# Patient Record
Sex: Female | Born: 1960 | Race: White | Hispanic: No | Marital: Married | Smoking: Current every day smoker
Health system: Southern US, Community
[De-identification: ages and names within clinical notes are randomized; demographics above are authoritative.]

## PROBLEM LIST (undated history)

## (undated) DIAGNOSIS — B351 Tinea unguium: Secondary | ICD-10-CM

## (undated) DIAGNOSIS — L409 Psoriasis, unspecified: Secondary | ICD-10-CM

## (undated) DIAGNOSIS — R079 Chest pain, unspecified: Secondary | ICD-10-CM

## (undated) DIAGNOSIS — K219 Gastro-esophageal reflux disease without esophagitis: Secondary | ICD-10-CM

## (undated) DIAGNOSIS — I209 Angina pectoris, unspecified: Secondary | ICD-10-CM

## (undated) DIAGNOSIS — R5383 Other fatigue: Secondary | ICD-10-CM

## (undated) DIAGNOSIS — J309 Allergic rhinitis, unspecified: Secondary | ICD-10-CM

## (undated) DIAGNOSIS — R0609 Other forms of dyspnea: Secondary | ICD-10-CM

## (undated) DIAGNOSIS — I10 Essential (primary) hypertension: Secondary | ICD-10-CM

## (undated) DIAGNOSIS — E785 Hyperlipidemia, unspecified: Secondary | ICD-10-CM

## (undated) DIAGNOSIS — R002 Palpitations: Secondary | ICD-10-CM

## (undated) DIAGNOSIS — L602 Onychogryphosis: Secondary | ICD-10-CM

## (undated) HISTORY — DX: Gastro-esophageal reflux disease without esophagitis: K21.9

## (undated) HISTORY — DX: Other fatigue: R53.83

## (undated) HISTORY — DX: Palpitations: R00.2

## (undated) HISTORY — DX: Hyperlipidemia, unspecified: E78.5

## (undated) HISTORY — DX: Angina pectoris, unspecified: I20.9

## (undated) HISTORY — PX: WISDOM TOOTH EXTRACTION: SHX21

## (undated) HISTORY — DX: Allergic rhinitis, unspecified: J30.9

## (undated) HISTORY — DX: Onychogryphosis: L60.2

## (undated) HISTORY — DX: Tinea unguium: B35.1

## (undated) HISTORY — DX: Chest pain, unspecified: R07.9

## (undated) HISTORY — DX: Psoriasis, unspecified: L40.9

## (undated) HISTORY — DX: Other forms of dyspnea: R06.09

---

## 1998-10-13 ENCOUNTER — Other Ambulatory Visit: Admission: RE | Admit: 1998-10-13 | Discharge: 1998-10-13 | Payer: Self-pay | Admitting: Gynecology

## 1999-04-07 ENCOUNTER — Other Ambulatory Visit: Admission: RE | Admit: 1999-04-07 | Discharge: 1999-04-07 | Payer: Self-pay | Admitting: Gynecology

## 2000-05-01 ENCOUNTER — Encounter: Admission: RE | Admit: 2000-05-01 | Discharge: 2000-05-01 | Payer: Self-pay | Admitting: Internal Medicine

## 2000-05-01 ENCOUNTER — Encounter: Payer: Self-pay | Admitting: Internal Medicine

## 2001-07-15 ENCOUNTER — Other Ambulatory Visit: Admission: RE | Admit: 2001-07-15 | Discharge: 2001-07-15 | Payer: Self-pay | Admitting: Obstetrics and Gynecology

## 2003-06-03 ENCOUNTER — Encounter: Payer: Self-pay | Admitting: Internal Medicine

## 2003-06-03 ENCOUNTER — Encounter: Admission: RE | Admit: 2003-06-03 | Discharge: 2003-06-03 | Payer: Self-pay | Admitting: Internal Medicine

## 2003-06-08 ENCOUNTER — Other Ambulatory Visit: Admission: RE | Admit: 2003-06-08 | Discharge: 2003-06-08 | Payer: Self-pay | Admitting: Obstetrics and Gynecology

## 2003-08-18 ENCOUNTER — Ambulatory Visit (HOSPITAL_COMMUNITY): Admission: RE | Admit: 2003-08-18 | Discharge: 2003-08-18 | Payer: Self-pay | Admitting: Obstetrics and Gynecology

## 2005-07-14 ENCOUNTER — Encounter: Admission: RE | Admit: 2005-07-14 | Discharge: 2005-07-14 | Payer: Self-pay | Admitting: Internal Medicine

## 2005-07-26 ENCOUNTER — Encounter: Admission: RE | Admit: 2005-07-26 | Discharge: 2005-07-26 | Payer: Self-pay | Admitting: Internal Medicine

## 2005-07-28 ENCOUNTER — Other Ambulatory Visit: Admission: RE | Admit: 2005-07-28 | Discharge: 2005-07-28 | Payer: Self-pay | Admitting: Internal Medicine

## 2008-10-07 ENCOUNTER — Encounter: Admission: RE | Admit: 2008-10-07 | Discharge: 2008-10-07 | Payer: Self-pay | Admitting: Family Medicine

## 2009-11-29 ENCOUNTER — Encounter: Admission: RE | Admit: 2009-11-29 | Discharge: 2009-11-29 | Payer: Self-pay | Admitting: Family Medicine

## 2015-08-26 ENCOUNTER — Ambulatory Visit: Payer: Self-pay | Admitting: Internal Medicine

## 2017-02-02 ENCOUNTER — Emergency Department (HOSPITAL_COMMUNITY): Payer: BLUE CROSS/BLUE SHIELD

## 2017-02-02 ENCOUNTER — Emergency Department (HOSPITAL_COMMUNITY)
Admission: EM | Admit: 2017-02-02 | Discharge: 2017-02-02 | Disposition: A | Discharge status: Home / Self Care | Payer: BLUE CROSS/BLUE SHIELD | Attending: Emergency Medicine | Admitting: Emergency Medicine

## 2017-02-02 ENCOUNTER — Encounter (HOSPITAL_COMMUNITY): Payer: Self-pay | Admitting: Emergency Medicine

## 2017-02-02 DIAGNOSIS — I10 Essential (primary) hypertension: Secondary | ICD-10-CM | POA: Insufficient documentation

## 2017-02-02 DIAGNOSIS — R1011 Right upper quadrant pain: Secondary | ICD-10-CM | POA: Diagnosis present

## 2017-02-02 DIAGNOSIS — N132 Hydronephrosis with renal and ureteral calculous obstruction: Secondary | ICD-10-CM | POA: Diagnosis not present

## 2017-02-02 DIAGNOSIS — N23 Unspecified renal colic: Secondary | ICD-10-CM

## 2017-02-02 HISTORY — DX: Essential (primary) hypertension: I10

## 2017-02-02 LAB — CBC WITH DIFFERENTIAL/PLATELET
Basophils Absolute: 0 10*3/uL (ref 0.0–0.1)
Basophils Relative: 0 %
Eosinophils Absolute: 0.1 10*3/uL (ref 0.0–0.7)
Eosinophils Relative: 1 %
HCT: 37.4 % (ref 36.0–46.0)
Hemoglobin: 12.3 g/dL (ref 12.0–15.0)
Lymphocytes Relative: 20 %
Lymphs Abs: 2.5 10*3/uL (ref 0.7–4.0)
MCH: 31.2 pg (ref 26.0–34.0)
MCHC: 32.9 g/dL (ref 30.0–36.0)
MCV: 94.9 fL (ref 78.0–100.0)
Monocytes Absolute: 0.8 10*3/uL (ref 0.1–1.0)
Monocytes Relative: 6 %
Neutro Abs: 9 10*3/uL — ABNORMAL HIGH (ref 1.7–7.7)
Neutrophils Relative %: 73 %
Platelets: 269 10*3/uL (ref 150–400)
RBC: 3.94 MIL/uL (ref 3.87–5.11)
RDW: 13.5 % (ref 11.5–15.5)
WBC: 12.3 10*3/uL — ABNORMAL HIGH (ref 4.0–10.5)

## 2017-02-02 LAB — COMPREHENSIVE METABOLIC PANEL
ALT: 55 U/L — ABNORMAL HIGH (ref 14–54)
AST: 47 U/L — ABNORMAL HIGH (ref 15–41)
Albumin: 4.4 g/dL (ref 3.5–5.0)
Alkaline Phosphatase: 65 U/L (ref 38–126)
Anion gap: 8 (ref 5–15)
BUN: 19 mg/dL (ref 6–20)
CO2: 26 mmol/L (ref 22–32)
Calcium: 8.5 mg/dL — ABNORMAL LOW (ref 8.9–10.3)
Chloride: 106 mmol/L (ref 101–111)
Creatinine, Ser: 0.83 mg/dL (ref 0.44–1.00)
GFR calc Af Amer: >60 mL/min (ref >60)
GFR calc non Af Amer: >60 mL/min (ref >60)
Glucose, Bld: 136 mg/dL — ABNORMAL HIGH (ref 65–99)
Potassium: 3.7 mmol/L (ref 3.5–5.1)
Sodium: 140 mmol/L (ref 135–145)
Total Bilirubin: 0.7 mg/dL (ref 0.3–1.2)
Total Protein: 7.5 g/dL (ref 6.5–8.1)

## 2017-02-02 LAB — LIPASE, BLOOD: Lipase: 23 U/L (ref 11–51)

## 2017-02-02 LAB — URINALYSIS, ROUTINE W REFLEX MICROSCOPIC
Bacteria, UA: NONE SEEN
Bilirubin Urine: NEGATIVE
Glucose, UA: NEGATIVE mg/dL
Ketones, ur: NEGATIVE mg/dL
Leukocytes, UA: NEGATIVE
Nitrite: NEGATIVE
Protein, ur: NEGATIVE mg/dL
Specific Gravity, Urine: 1.02 (ref 1.005–1.030)
pH: 5 (ref 5.0–8.0)

## 2017-02-02 MED ORDER — ONDANSETRON HCL 4 MG/2ML IJ SOLN
4.0000 mg | Freq: Once | INTRAMUSCULAR | Status: AC
  Administered 2017-02-02: 4 mg via INTRAVENOUS
  Filled 2017-02-02: qty 2

## 2017-02-02 MED ORDER — SODIUM CHLORIDE 0.9 % IV BOLUS (SEPSIS)
1000.0000 mL | Freq: Once | INTRAVENOUS | Status: AC
  Administered 2017-02-02: 1000 mL via INTRAVENOUS

## 2017-02-02 MED ORDER — ONDANSETRON 4 MG PO TBDP: 4.0000 mg | ORAL_TABLET | Freq: Three times a day (TID) | ORAL | 0 refills | Status: AC | PRN

## 2017-02-02 MED ORDER — KETOROLAC TROMETHAMINE 15 MG/ML IJ SOLN
15.0000 mg | Freq: Once | INTRAMUSCULAR | Status: AC
  Administered 2017-02-02: 15 mg via INTRAVENOUS
  Filled 2017-02-02: qty 1

## 2017-02-02 MED ORDER — HYDROMORPHONE HCL 2 MG/ML IJ SOLN
1.0000 mg | Freq: Once | INTRAMUSCULAR | Status: AC
  Administered 2017-02-02: 1 mg via INTRAVENOUS
  Filled 2017-02-02: qty 1

## 2017-02-02 MED ORDER — OXYCODONE HCL 5 MG PO TABS: 5.0000 mg | ORAL_TABLET | ORAL | 0 refills | Status: AC | PRN

## 2017-02-02 MED ORDER — TAMSULOSIN HCL 0.4 MG PO CAPS: 0.4000 mg | ORAL_CAPSULE | Freq: Every day | ORAL | 0 refills | Status: AC

## 2017-02-02 NOTE — ED Notes (Signed)
Bed: AV40 Expected date:  Expected time:  Means of arrival:  Comments: Rt sided flank pain

## 2017-02-02 NOTE — ED Notes (Signed)
Patient transported to CT 

## 2017-02-02 NOTE — ED Triage Notes (Signed)
Pt awoken from sleep with right flank pin severe in nature state was unable voided following urgency. Denies PMHx stones. 150 mcg Fentanyl  Total in 50 mcg doses also Zofran  per EMS 141/55 P-68 sat 95%RA

## 2017-02-02 NOTE — ED Provider Notes (Signed)
[facility]-EMERGENCY DEPT Provider Note   CSN: [id number] Arrival date & time: 02/02/17  0445     History   Chief Complaint Chief Complaint  Patient presents with  . Flank Pain    HPI Katrina Kent is a 56 y.o. female.  HPI  56 year old female with a history of hypertension and hypercholesterolemia presents with sudden onset right flank pain. Woke her up at about 2:45 AM. The pain is constant and severe. She has had 2 episodes of emesis and continuous nausea. No hematemesis. No fevers. Has felt the urge to go to the bathroom but has only had small dribbles of urine. No hematuria. No prior history of pain like this or ureteral stones. Pain is currently severe. She has been given 3 different doses of 50 g fentanyl with partial relief each time but then wears off. Has also been given IV Zofran. Currently nauseated. No previous history of abdominal surgery. The pain does not radiate. Patient is menopausal, LMP over 1 year ago.  Past Medical History:  Diagnosis Date  . Hypertension     There are no active problems to display for this patient.   No past surgical history on file.  OB History    No data available       Home Medications    Prior to Admission medications   Medication Sig Start Date End Date Taking? Authorizing Provider  amoxicillin (AMOXIL) 500 MG capsule Take 1 capsule by mouth 3 (three) times daily. 01/25/17  Yes Historical Provider, MD  loratadine (CLARITIN) 10 MG tablet Take 10 mg by mouth daily.   Yes Historical Provider, MD  metoprolol succinate (TOPROL-XL) 25 MG 24 hr tablet Take 25 mg by mouth daily. 01/02/17  Yes Historical Provider, MD  rosuvastatin (CRESTOR) 20 MG tablet Take 20 mg by mouth daily. 01/08/17  Yes Historical Provider, MD  ondansetron (ZOFRAN ODT) 4 MG disintegrating tablet Take 1 tablet (4 mg total) by mouth every 8 (eight) hours as needed for nausea or vomiting. 02/02/17   Pricilla Loveless, MD  oxyCODONE (ROXICODONE) 5 MG immediate release tablet  Take 1-2 tablets (5-10 mg total) by mouth every 4 (four) hours as needed for severe pain or breakthrough pain. 02/02/17   Pricilla Loveless, MD  tamsulosin (FLOMAX) 0.4 MG CAPS capsule Take 1 capsule (0.4 mg total) by mouth daily. 02/02/17   Pricilla Loveless, MD    Family History No family history on file.  Social History Social History  Substance Use Topics  . Smoking status: Not on file  . Smokeless tobacco: Not on file  . Alcohol use Not on file     Allergies   Patient has no known allergies.   Review of Systems Review of Systems  Constitutional: Negative for fever.  Gastrointestinal: Positive for abdominal pain (Right upper), nausea and vomiting. Negative for diarrhea.  Genitourinary: Positive for decreased urine volume, difficulty urinating and flank pain. Negative for dysuria and hematuria.  All other systems reviewed and are negative.    Physical Exam Updated Vital Signs BP 133/85 (BP Location: Left Arm)   Pulse 74   Temp 97.5 F (36.4 C) (Oral)   Resp 18   Ht  (1.549 m)   Wt 147 lb (66.7 kg)   SpO2 100%   BMI 27.78 kg/m   Physical Exam  Constitutional: She is oriented to person, place, and time. She appears well-developed and well-nourished. She appears distressed (in pain, moaning).  HENT:  Head: Normocephalic and atraumatic.  Right Ear: External ear  normal.  Left Ear: External ear normal.  Nose: Nose normal.  Eyes: Right eye exhibits no discharge. Left eye exhibits no discharge.  Cardiovascular: Normal rate, regular rhythm and normal heart sounds.   Pulmonary/Chest: Effort normal and breath sounds normal.  Abdominal: Soft. There is tenderness in the right upper quadrant.  Right flank/CVA and RUQ tenderness  Neurological: She is alert and oriented to person, place, and time.  Skin: Skin is warm and dry. She is not diaphoretic.  Nursing note and vitals reviewed.    ED Treatments / Results  Labs (all labs ordered are listed, but only abnormal results  are displayed) Labs Reviewed  URINALYSIS, ROUTINE W REFLEX MICROSCOPIC - Abnormal; Notable for the following:       Result Value   Hgb urine dipstick LARGE (*)    Squamous Epithelial / LPF 0-5 (*)    All other components within normal limits  CBC WITH DIFFERENTIAL/PLATELET - Abnormal; Notable for the following:    WBC 12.3 (*)    Neutro Abs 9.0 (*)    All other components within normal limits  COMPREHENSIVE METABOLIC PANEL - Abnormal; Notable for the following:    Glucose, Bld 136 (*)    Calcium 8.5 (*)    AST 47 (*)    ALT 55 (*)    All other components within normal limits  LIPASE, BLOOD    EKG  EKG Interpretation None       Radiology Ct Renal Stone Study  Result Date: 02/02/2017 CLINICAL DATA:  Acute onset of severe right flank pain and increased urinary urgency. Initial encounter. EXAM: CT ABDOMEN AND PELVIS WITHOUT CONTRAST TECHNIQUE: Multidetector CT imaging of the abdomen and pelvis was performed following the standard protocol without IV contrast. COMPARISON:  Pelvic ultrasound performed 08/18/2003 FINDINGS: Lower chest: Bibasilar atelectasis is noted. A small to moderate hiatal hernia is noted. Hepatobiliary: Diffuse fatty infiltration within the liver. The gallbladder is unremarkable in appearance. The common bile duct remains normal in caliber. Pancreas: The pancreas is within normal limits. Spleen: The spleen is unremarkable in appearance. Adrenals/Urinary Tract: The adrenal glands are unremarkable in appearance. Minimal right-sided hydronephrosis is noted, with right-sided perinephric stranding. A 3 mm obstructing stone is noted distally at the right vesicoureteral junction. The left kidney is grossly unremarkable. No nonobstructing renal stones are identified. Stomach/Bowel: The stomach is unremarkable in appearance. The small bowel is within normal limits. The appendix is not visualized; there is no evidence for appendicitis. The colon is unremarkable in appearance.  Vascular/Lymphatic: Scattered calcification is seen along the abdominal aorta and its branches. The abdominal aorta is otherwise grossly unremarkable. The inferior vena cava is grossly unremarkable. No retroperitoneal lymphadenopathy is seen. No pelvic sidewall lymphadenopathy is identified. Reproductive: The bladder is mildly distended and otherwise unremarkable. The uterus is unremarkable in appearance. The ovaries are relatively symmetric. No suspicious adnexal masses are seen. Other: No additional soft tissue abnormalities are seen. Musculoskeletal: No acute osseous abnormalities are identified. Chronic osseous fusion is noted at T11-T12. The visualized musculature is unremarkable in appearance. IMPRESSION: 1. Minimal right-sided hydronephrosis, with a 3 mm obstructing stone noted distally at the right vesicoureteral junction. 2. Scattered aortic atherosclerosis. 3. Diffuse fatty infiltration within the liver. 4. Small to moderate hiatal hernia. 5. Bibasilar atelectasis. Electronically Signed   By: Roanna Raider M.D.   On: 02/02/2017 06:13    Procedures Procedures (including critical care time)  Medications Ordered in ED Medications  HYDROmorphone (DILAUDID) injection 1 mg (1 mg Intravenous Given 02/02/17  1610)  ondansetron (ZOFRAN) injection 4 mg (4 mg Intravenous Given 02/02/17 0505)  sodium chloride 0.9 % bolus 1,000 mL (1,000 mLs Intravenous New Bag/Given 02/02/17 0516)  HYDROmorphone (DILAUDID) injection 1 mg (1 mg Intravenous Given 02/02/17 0626)  ketorolac (TORADOL) 15 MG/ML injection 15 mg (15 mg Intravenous Given 02/02/17 0626)     Initial Impression / Assessment and Plan / ED Course  I have reviewed the triage vital signs and the nursing notes.  Pertinent labs & imaging results that were available during my care of the patient were reviewed by me and considered in my medical decision making (see chart for details).     Patient has a distal 3 mm right ureteral stone. Patient's pain  has been controlled with IV Dilaudid and IV Toradol. Renal function is normal. No signs of UTI. Nausea and vomiting is controlled. Will discharge with oxycodone, Flomax, Zofran, and instructed use NSAIDs at home as well. Given a strainer. Follow up with urology. Discussed strict return per cautions.  Final Clinical Impressions(s) / ED Diagnoses   Final diagnoses:  Ureteral colic    New Prescriptions New Prescriptions   ONDANSETRON (ZOFRAN ODT) 4 MG DISINTEGRATING TABLET    Take 1 tablet (4 mg total) by mouth every 8 (eight) hours as needed for nausea or vomiting.   OXYCODONE (ROXICODONE) 5 MG IMMEDIATE RELEASE TABLET    Take 1-2 tablets (5-10 mg total) by mouth every 4 (four) hours as needed for severe pain or breakthrough pain.   TAMSULOSIN (FLOMAX) 0.4 MG CAPS CAPSULE    Take 1 capsule (0.4 mg total) by mouth daily.     Pricilla Loveless, MD 02/02/17 [phone]

## 2018-04-05 ENCOUNTER — Other Ambulatory Visit: Payer: Self-pay | Admitting: Family Medicine

## 2018-04-05 ENCOUNTER — Ambulatory Visit
Admission: RE | Admit: 2018-04-05 | Discharge: 2018-04-05 | Disposition: A | Discharge status: Home / Self Care | Payer: Self-pay | Source: Ambulatory Visit | Attending: Family Medicine | Admitting: Family Medicine

## 2018-04-05 ENCOUNTER — Ambulatory Visit
Admission: RE | Admit: 2018-04-05 | Discharge: 2018-04-05 | Disposition: A | Discharge status: Home / Self Care | Payer: BLUE CROSS/BLUE SHIELD | Source: Ambulatory Visit | Attending: Family Medicine | Admitting: Family Medicine

## 2018-04-05 DIAGNOSIS — R05 Cough: Secondary | ICD-10-CM

## 2018-04-05 DIAGNOSIS — R059 Cough, unspecified: Secondary | ICD-10-CM

## 2018-04-05 DIAGNOSIS — Z720 Tobacco use: Secondary | ICD-10-CM

## 2018-04-08 ENCOUNTER — Ambulatory Visit: Payer: BLUE CROSS/BLUE SHIELD

## 2019-12-25 ENCOUNTER — Ambulatory Visit: Payer: Self-pay | Attending: Internal Medicine

## 2019-12-25 DIAGNOSIS — Z23 Encounter for immunization: Secondary | ICD-10-CM | POA: Insufficient documentation

## 2019-12-25 NOTE — Progress Notes (Signed)
   Covid-19 Vaccination Clinic  Name:  [name]    MRN: [id number] DOB: 04-23-1961  12/25/2019  Ms. [name] was observed post Covid-19 immunization for 15 minutes without incident. She was provided with Vaccine Information Sheet and instruction to access the V-Safe system.   Ms. [name] was instructed to call 911 with any severe reactions post vaccine: Marland Kitchen Difficulty breathing  . Swelling of face and throat  . A fast heartbeat  . A bad rash all over body  . Dizziness and weakness

## 2020-01-27 ENCOUNTER — Ambulatory Visit: Payer: Self-pay | Attending: Internal Medicine

## 2020-01-27 DIAGNOSIS — Z23 Encounter for immunization: Secondary | ICD-10-CM

## 2020-01-27 NOTE — Progress Notes (Signed)
   Covid-19 Vaccination Clinic  Name:  Katrina Kent    MRN: [id number] DOB: 1961/07/18  01/27/2020  Katrina Kent was observed post Covid-19 immunization for 15 minutes without incident. She was provided with Vaccine Information Sheet and instruction to access the V-Safe system.   Katrina Kent was instructed to call 911 with any severe reactions post vaccine: Marland Kitchen Difficulty breathing  . Swelling of face and throat  . A fast heartbeat  . A bad rash all over body  . Dizziness and weakness   Immunizations Administered    Name Date Dose VIS Date Route   Moderna COVID-19 Vaccine 01/27/2020 10:12 AM 0.5 mL 09/23/2019 Intramuscular   Manufacturer: Moderna   Lot: [id number]   NDC: 41712-787-18

## 2021-05-10 ENCOUNTER — Other Ambulatory Visit: Payer: Self-pay | Admitting: Physician Assistant

## 2021-05-10 DIAGNOSIS — Z1231 Encounter for screening mammogram for malignant neoplasm of breast: Secondary | ICD-10-CM

## 2021-05-12 ENCOUNTER — Ambulatory Visit: Payer: Self-pay

## 2021-05-16 ENCOUNTER — Ambulatory Visit
Admission: RE | Admit: 2021-05-16 | Discharge: 2021-05-16 | Disposition: A | Discharge status: Home / Self Care | Payer: BC Managed Care – PPO | Source: Ambulatory Visit | Attending: Physician Assistant | Admitting: Physician Assistant

## 2021-05-16 ENCOUNTER — Other Ambulatory Visit: Payer: Self-pay

## 2021-05-16 DIAGNOSIS — Z1231 Encounter for screening mammogram for malignant neoplasm of breast: Secondary | ICD-10-CM

## 2021-05-19 ENCOUNTER — Other Ambulatory Visit: Payer: Self-pay | Admitting: Physician Assistant

## 2022-02-02 IMAGING — MG MM DIGITAL SCREENING BILAT W/ TOMO AND CAD
8 series · 9 of 24 positions shown · non-contrast
Comparison: Previous exam(s).

CLINICAL DATA: Screening.

EXAM:
DIGITAL SCREENING BILATERAL MAMMOGRAM WITH TOMOSYNTHESIS AND CAD
TECHNIQUE: Bilateral screening digital craniocaudal and mediolateral oblique
mammograms were obtained. Bilateral screening digital breast
tomosynthesis was performed. The images were evaluated with
computer-aided detection.

[L MLO synth-2D]
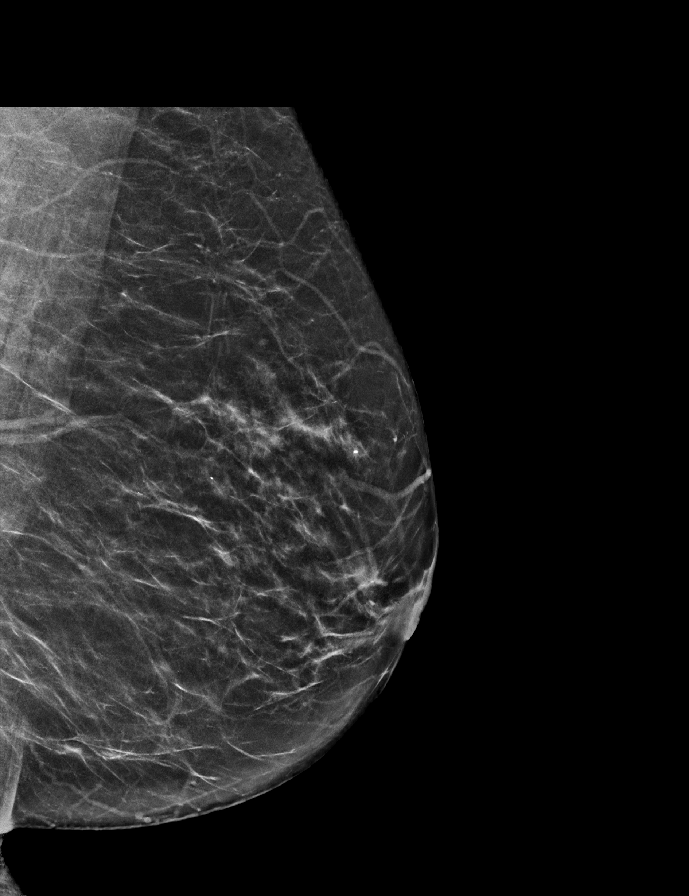

[R CC synth-2D]
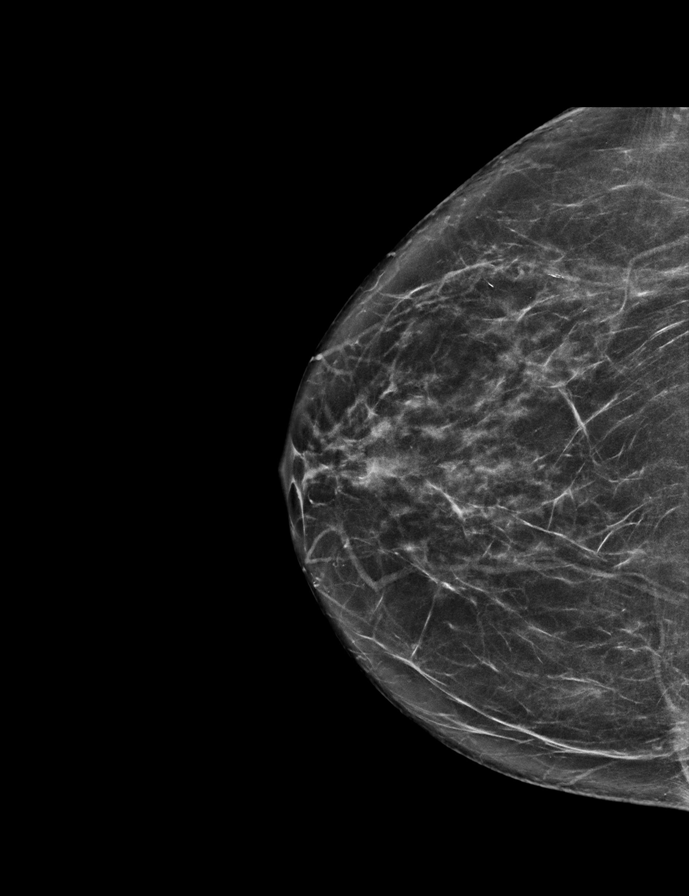

[L CC synth-2D]
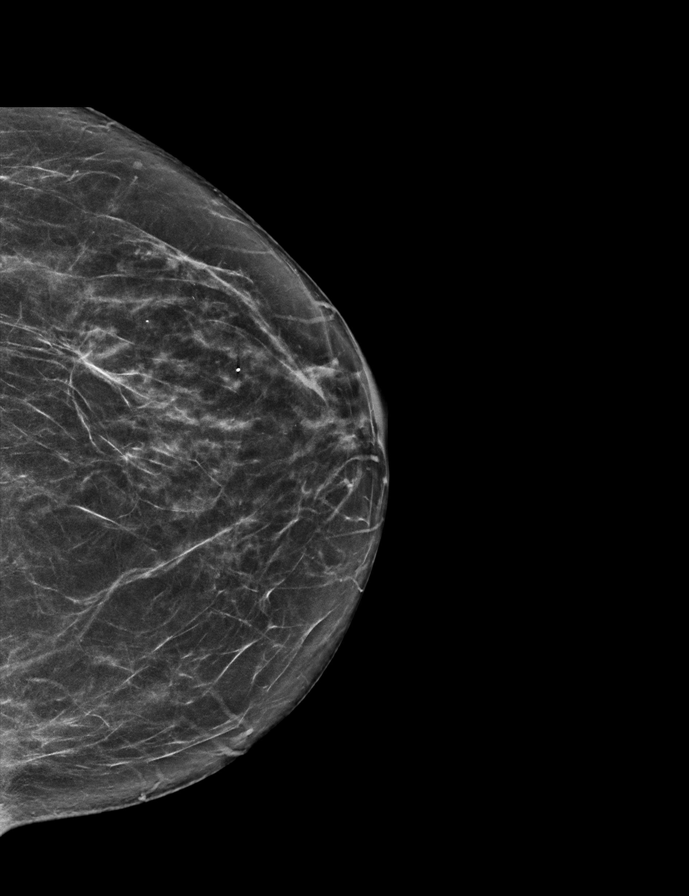

[R MLO synth-2D]
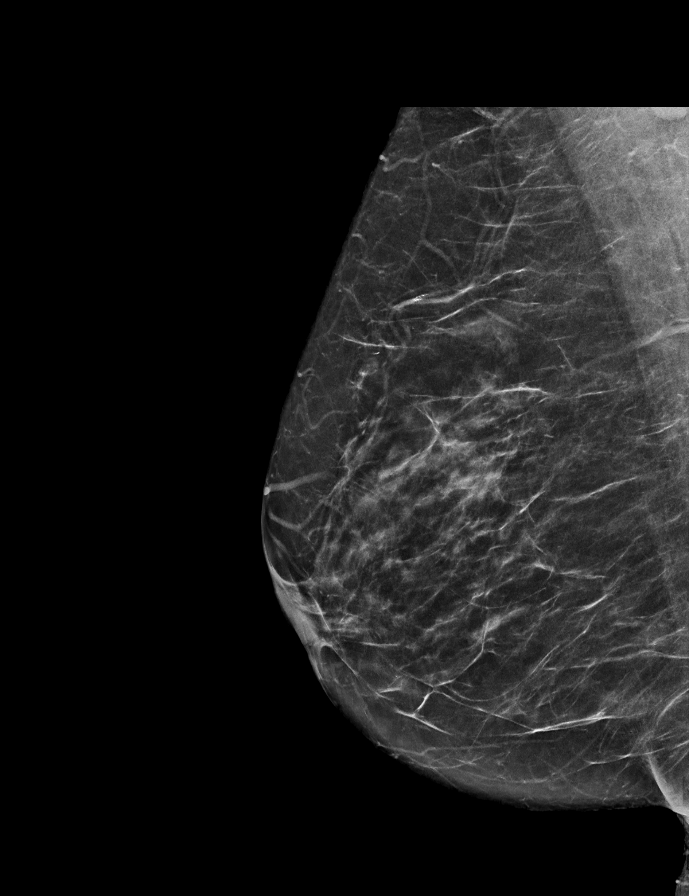

[R MLO tomo · 2 of 61 frames shown]
[frame 20/61]
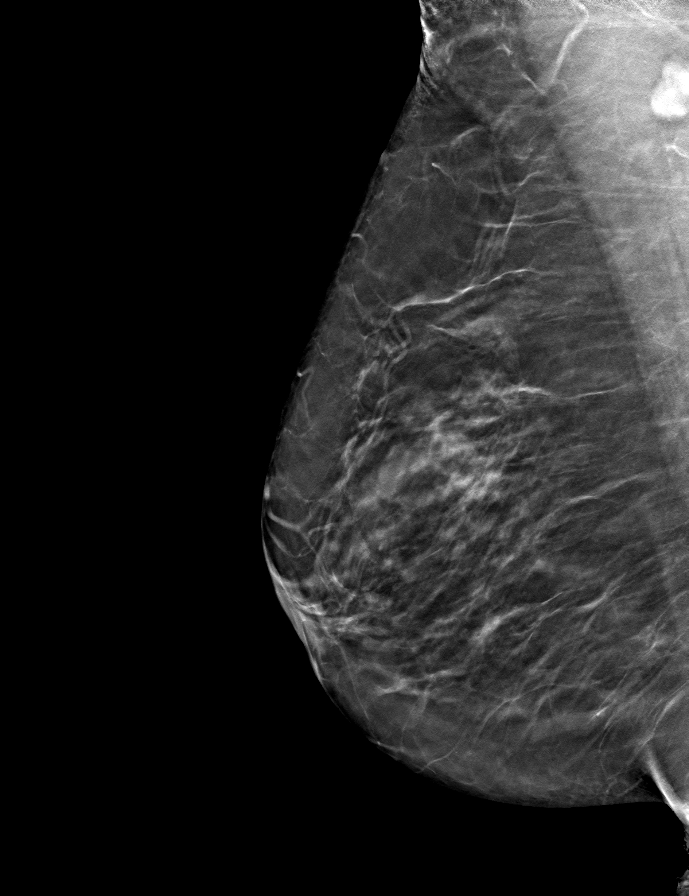
[frame 31/61]
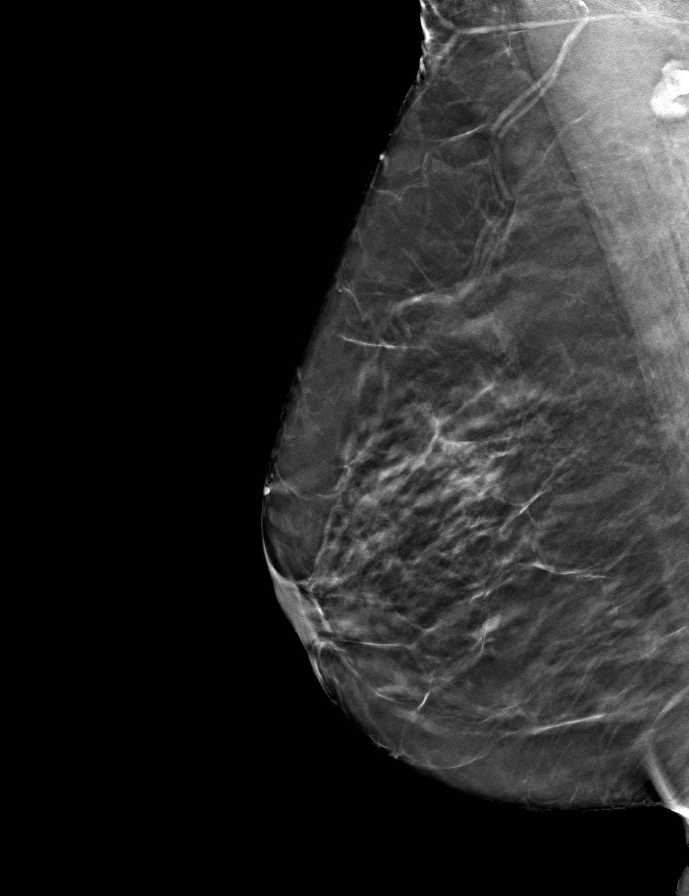

[L MLO tomo · tomo slice 29/57.0]
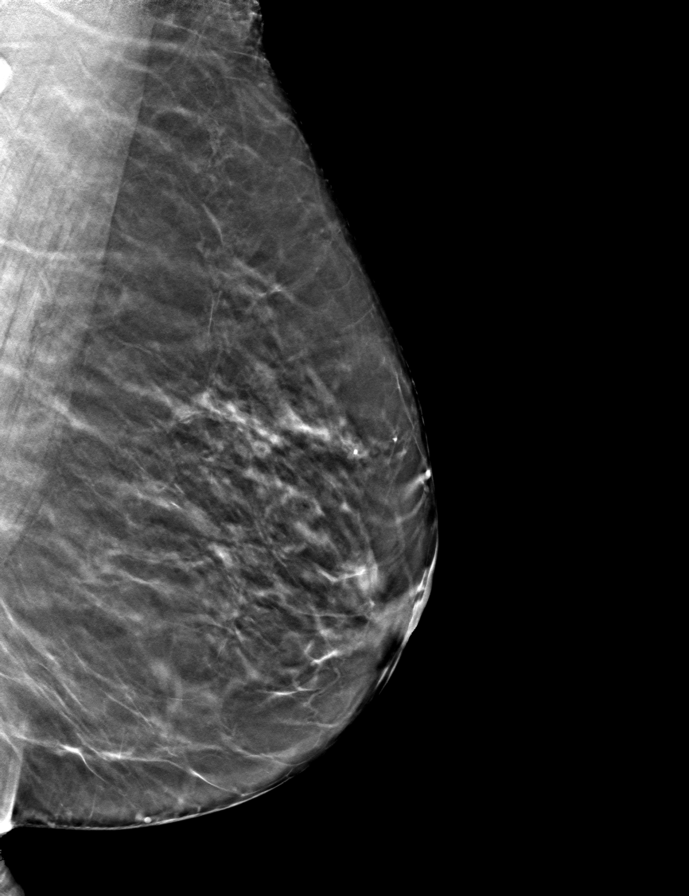

[L CC tomo · tomo slice 31/60.0]
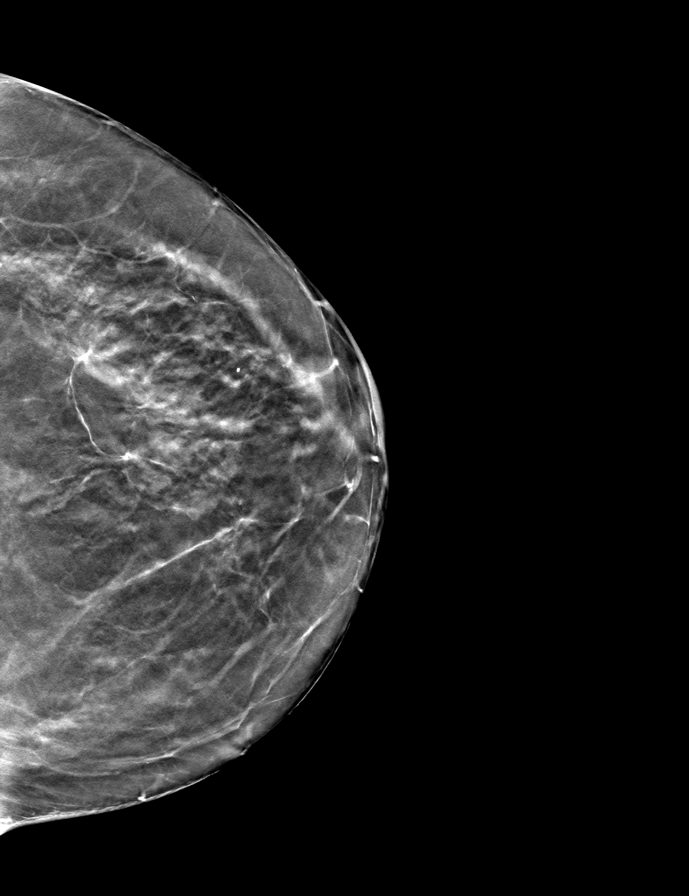

[R CC tomo · tomo slice 31/62.0]
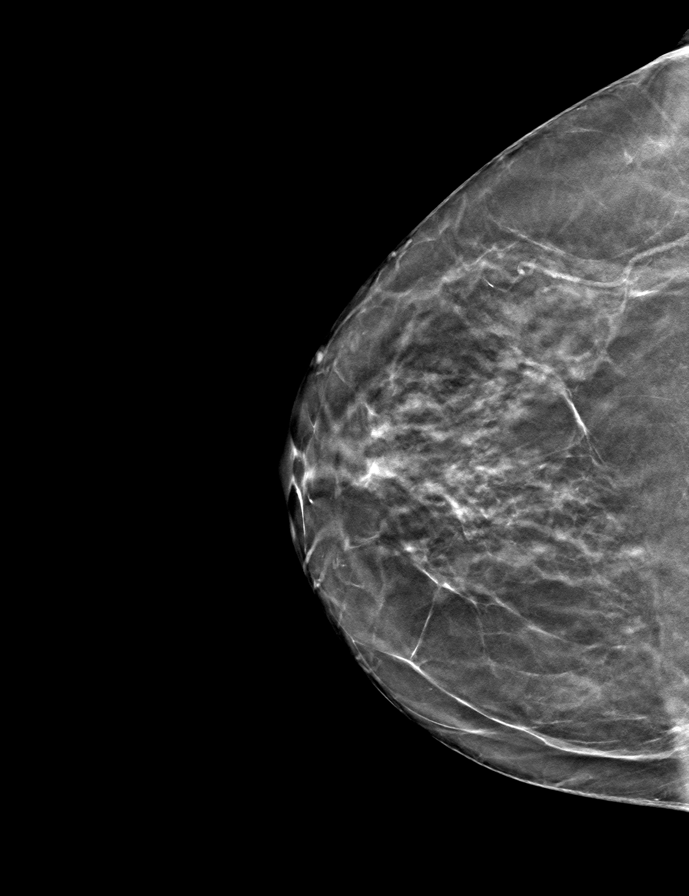

[9 of 24 positions shown; findings below may reference images not displayed]

ACR Breast Density Category b: There are scattered areas of
fibroglandular density.
FINDINGS: There are no findings suspicious for malignancy.
IMPRESSION: No mammographic evidence of malignancy. A result letter of this
screening mammogram will be mailed directly to the patient.

RECOMMENDATION:
Screening mammogram in one year. (Code:51-O-LD2)

BI-RADS CATEGORY  1: Negative.

## 2022-09-06 DIAGNOSIS — E7800 Pure hypercholesterolemia, unspecified: Secondary | ICD-10-CM | POA: Diagnosis not present

## 2022-09-06 DIAGNOSIS — Z23 Encounter for immunization: Secondary | ICD-10-CM | POA: Diagnosis not present

## 2022-09-06 DIAGNOSIS — K219 Gastro-esophageal reflux disease without esophagitis: Secondary | ICD-10-CM | POA: Diagnosis not present

## 2022-09-06 DIAGNOSIS — I10 Essential (primary) hypertension: Secondary | ICD-10-CM | POA: Diagnosis not present

## 2022-09-06 DIAGNOSIS — I209 Angina pectoris, unspecified: Secondary | ICD-10-CM | POA: Diagnosis not present

## 2023-06-15 DIAGNOSIS — R002 Palpitations: Secondary | ICD-10-CM | POA: Diagnosis not present

## 2023-06-15 DIAGNOSIS — R5383 Other fatigue: Secondary | ICD-10-CM | POA: Diagnosis not present

## 2023-06-15 DIAGNOSIS — R0609 Other forms of dyspnea: Secondary | ICD-10-CM | POA: Diagnosis not present

## 2023-06-15 DIAGNOSIS — R079 Chest pain, unspecified: Secondary | ICD-10-CM | POA: Diagnosis not present

## 2023-06-16 DIAGNOSIS — R079 Chest pain, unspecified: Secondary | ICD-10-CM | POA: Diagnosis not present

## 2023-06-16 DIAGNOSIS — R002 Palpitations: Secondary | ICD-10-CM | POA: Diagnosis not present

## 2023-06-18 DIAGNOSIS — R5383 Other fatigue: Secondary | ICD-10-CM | POA: Diagnosis not present

## 2023-06-18 DIAGNOSIS — R0609 Other forms of dyspnea: Secondary | ICD-10-CM | POA: Diagnosis not present

## 2023-06-19 DIAGNOSIS — N3000 Acute cystitis without hematuria: Secondary | ICD-10-CM | POA: Diagnosis not present

## 2023-09-06 ENCOUNTER — Ambulatory Visit: Payer: BC Managed Care – PPO | Admitting: Cardiology

## 2023-11-14 ENCOUNTER — Ambulatory Visit: Payer: BC Managed Care – PPO | Attending: Cardiology | Admitting: Cardiology

## 2023-11-14 ENCOUNTER — Encounter: Payer: Self-pay | Admitting: Cardiology

## 2023-11-14 VITALS — BP 140/82 | HR 84 | Ht 61.0 in | Wt 129.6 lb

## 2023-11-14 DIAGNOSIS — Z87891 Personal history of nicotine dependence: Secondary | ICD-10-CM | POA: Diagnosis not present

## 2023-11-14 DIAGNOSIS — R011 Cardiac murmur, unspecified: Secondary | ICD-10-CM

## 2023-11-14 DIAGNOSIS — R072 Precordial pain: Secondary | ICD-10-CM | POA: Diagnosis not present

## 2023-11-14 DIAGNOSIS — R748 Abnormal levels of other serum enzymes: Secondary | ICD-10-CM

## 2023-11-14 MED ORDER — AMLODIPINE BESYLATE 5 MG PO TABS: 5.0000 mg | ORAL_TABLET | Freq: Every day | ORAL | 3 refills | Status: DC

## 2023-11-14 MED ORDER — METOPROLOL TARTRATE 100 MG PO TABS: 100.0000 mg | ORAL_TABLET | Freq: Once | ORAL | 0 refills | Status: AC

## 2023-11-14 NOTE — Progress Notes (Signed)
Cardiology Office Note:  .   Date:  11/14/2023  ID:  Katrina Kent, DOB 05-03-1961, MRN [id number] PCP: [facility]  [facility] Providers Cardiologist:  Katrina Mainland, MD PCP: [facility]  Chief Complaint  Patient presents with   Chest Pain      History of Present Illness: .    Katrina Kent is a 63 y.o. female with hypertension, hyperlipidemia, former > 50 PY smoker, currently per, chest pain.  Patient previously seen Dr. Jacinto Kent many years ago.  Prior workup not available for my review.  She reports that she had "angina" attacks.  However, patient describes them as sharp chest pain lasting for few seconds, unrelated to exertion.  These episodes occurred in summer 2024, and have not occurred since.  She walks 15-20 minutes couple times a week without any significant complaints of chest pain or shortness of breath.  She has >50-pack-year smoking history, quit in 2015.  Since then, she has been vaping, but hopes to quit soon.  Blood pressure is elevated today, and usually remains around the same range.  She has been on metoprolol succinate 25 mg daily supposedly for hypertension.  Vitals:   11/14/23 1050  BP: (!) 140/82  Pulse: 84  SpO2: 96%     ROS:  Review of Systems  Cardiovascular:  Positive for chest pain. Negative for dyspnea on exertion, leg swelling, palpitations and syncope.     Studies Reviewed: Marland Kitchen        EKG 11/14/2023: Normal sinus rhythm Low voltage QRS Cannot rule out Anterior infarct , age undetermined No previous ECGs available  Independently interpreted 08/2022: Chol 229, TG 180, HDL 68, LDL 126  05/2023: Hb 13.7 Cr 0.58 BNP<50    Physical Exam:   Physical Exam Vitals and nursing note reviewed.  Constitutional:      General: She is not in acute distress. Neck:     Vascular: No JVD.  Cardiovascular:     Rate and Rhythm: Normal rate and regular rhythm.     Heart  sounds: Murmur heard.     High-pitched blowing holosystolic murmur is present with a grade of 2/6 at the apex.  Pulmonary:     Effort: Pulmonary effort is normal.     Breath sounds: Normal breath sounds. No wheezing or rales.  Musculoskeletal:     Right lower leg: No edema.     Left lower leg: No edema.      VISIT DIAGNOSES:   ICD-10-CM   1. Precordial pain  R07.2 EKG 12-Lead    2. Elevated liver enzymes  R74.8     3. Former smoker  Z87.891     4. Cardiac murmur  R01.1        ASSESSMENT AND PLAN: .    Katrina Kent is a 63 y.o. female with  hypertension, hyperlipidemia, former > 50 PY smoker, currently per, chest pain.  Chest pain: Episodes occurred in summer 2024, none since then.  Chest pain unlikely to be angina.  However, she has multiple risk factors for CAD including hypertension, hyperlipidemia, former smoker. Which coronary CT angiogram to establish baseline coronary anatomy. She will take metoprolol tartrate before CT angiogram as per protocol. Continue Crestor 20 mg daily. Check lipid panel today, along with CMP and CBC. Counseled regarding vaping cessation.  Hypertension: Blood pressure mildly elevated.  Currently on metoprolol succinate 25 mg daily. Instead of metoprolol succinate, I will start on amlodipine 5 mg daily. She  will check blood pressure regularly at home and send [place] a MyChart message.  Murmur: For systolic murmur, likely mild regurgitation. Will check echocardiogram.      Meds ordered this encounter  Medications   amLODipine (NORVASC) 5 MG tablet    Sig: Take 1 tablet (5 mg total) by mouth daily.    Dispense:  90 tablet    Refill:  3     F/u in 6 months  Signed, Elder Negus, MD

## 2023-11-14 NOTE — Patient Instructions (Signed)
Medication Instructions:   STOP Metoprolol Succinate (Toprol XL)  START Amlodipine 5 mg once daily  *If you need a refill on your cardiac medications before your next appointment, please call your pharmacy*   Lab Work:  TODAY: CMP, CBC and Lipid panel  If you have labs (blood work) drawn today and your tests are completely normal, you will receive your results only by: MyChart Message (if you have MyChart) OR A paper copy in the mail If you have any lab test that is abnormal or we need to change your treatment, we will call you to review the results.   Testing/Procedures: Your physician has requested that you have an echocardiogram. Echocardiography is a painless test that uses sound waves to create images of your heart. It provides your doctor with information about the size and shape of your heart and how well your heart's chambers and valves are working. This procedure takes approximately one hour. There are no restrictions for this procedure. Please do NOT wear cologne, perfume, aftershave, or lotions (deodorant is allowed). Please arrive 15 minutes prior to your appointment time.  Please note: We ask at that you not bring children with you during ultrasound (echo/ vascular) testing. Due to room size and safety concerns, children are not allowed in the ultrasound rooms during exams. Our front office staff cannot provide observation of children in our lobby area while testing is being conducted. An adult accompanying a patient to their appointment will only be allowed in the ultrasound room at the discretion of the ultrasound technician under special circumstances. We apologize for any inconvenience.    Your cardiac CT will be scheduled at one of the below locations:   [facility] [address] [place], [place] [address] [phone]   If scheduled at [facility], please arrive at the [facility] and Children's Entrance (Entrance C2) of [facility] 30  minutes prior to test start time. You can use the FREE valet parking offered at entrance C (encouraged to control the heart rate for the test)  Proceed to the [facility] Radiology Department (first floor) to check-in and test prep.  All radiology patients and guests should use entrance C2 at [facility], accessed from [facility], even though the hospital's physical address listed is [address].     There is spacious parking and easy access to the radiology department from the [facility] Heart and Vascular entrance. Please enter here and check-in with the desk attendant.   Please follow these instructions carefully (unless otherwise directed):  An IV will be required for this test and Nitroglycerin will be given.  Hold all erectile dysfunction medications at least 3 days (72 hrs) prior to test. (Ie viagra, cialis, sildenafil, tadalafil, etc)   On the Night Before the Test: Be sure to Drink plenty of water. Do not consume any caffeinated/decaffeinated beverages or chocolate 12 hours prior to your test. Do not take any antihistamines 12 hours prior to your test.  On the Day of the Test: Drink plenty of water until 1 hour prior to the test. Do not eat any food 1 hour prior to test. You may take your regular medications prior to the test.  Take metoprolol (Lopressor) 100 mg by mouth two hours prior to test. Patients who wear a continuous glucose monitor MUST remove the device prior to scanning. FEMALES- please wear underwire-free bra if available, avoid dresses & tight clothing  After the Test: Drink plenty of water. After  receiving IV contrast, you may experience a mild flushed feeling. This is normal. On occasion, you may experience a mild rash up to 24 hours after the test. This is not dangerous. If this occurs, you can take Benadryl 25 mg and increase your fluid intake. If you experience trouble breathing, this can be serious. If it is severe call 911  IMMEDIATELY. If it is mild, please call our office.  We will call to schedule your test 2-4 weeks out understanding that some insurance companies will need an authorization prior to the service being performed.   For more information and frequently asked questions, please visit our website : [email or web address]  For non-scheduling related questions, please contact the cardiac imaging nurse navigator should you have any questions/concerns: Cardiac Imaging Nurse Navigators Direct Office Dial: [phone]   For scheduling needs, including cancellations and rescheduling, please call [place], [phone].    Follow-Up: At [facility], you and your health needs are our priority.  As part of our continuing mission to provide you with exceptional heart care, we have created designated Provider Care Teams.  These Care Teams include your primary Cardiologist (physician) and Advanced Practice Providers (APPs -  Physician Assistants and Nurse Practitioners) who all work together to provide you with the care you need, when you need it.  We recommend signing up for the patient portal called "MyChart".  Sign up information is provided on this After Visit Summary.  MyChart is used to connect with patients for Virtual Visits (Telemedicine).  Patients are able to view lab/test results, encounter notes, upcoming appointments, etc.  Non-urgent messages can be sent to your provider as well.   To learn more about what you can do with MyChart, go to [email or web address].    Your next appointment:   1 year(s)  Provider:   Elder Negus, MD     Other Instructions   1st Floor: - Lobby - Registration  - Pharmacy  - Lab - Cafe  2nd Floor: - PV Lab - Diagnostic Testing (echo, CT, nuclear med)  3rd Floor: - Vacant  4th Floor: - TCTS (cardiothoracic surgery) - AFib Clinic - Structural Heart Clinic - Vascular Surgery  - Vascular Ultrasound  5th Floor: - HeartCare  Cardiology (general and EP) - Clinical Pharmacy for coumadin, hypertension, lipid, weight-loss medications, and med management appointments    Valet parking services will be available as well.

## 2023-11-15 LAB — CBC
Hematocrit: 40.1 % (ref 34.0–46.6)
Hemoglobin: 13.1 g/dL (ref 11.1–15.9)
MCH: 31.6 pg (ref 26.6–33.0)
MCHC: 32.7 g/dL (ref 31.5–35.7)
MCV: 97 fL (ref 79–97)
Platelets: 259 10*3/uL (ref 150–450)
RBC: 4.14 x10E6/uL (ref 3.77–5.28)
RDW: 12.3 % (ref 11.7–15.4)
WBC: 7.8 10*3/uL (ref 3.4–10.8)

## 2023-11-15 LAB — COMPREHENSIVE METABOLIC PANEL
ALT: 24 [IU]/L (ref 0–32)
AST: 23 [IU]/L (ref 0–40)
Albumin: 4.6 g/dL (ref 3.8–4.9)
Alkaline Phosphatase: 80 [IU]/L (ref 44–121)
BUN/Creatinine Ratio: 19 (ref 12–28)
BUN: 10 mg/dL (ref 8–27)
Bilirubin Total: 0.5 mg/dL (ref 0.0–1.2)
CO2: 25 mmol/L (ref 20–29)
Calcium: 9.6 mg/dL (ref 8.7–10.3)
Chloride: 102 mmol/L (ref 96–106)
Creatinine, Ser: 0.54 mg/dL — ABNORMAL LOW (ref 0.57–1.00)
Globulin, Total: 2.3 g/dL (ref 1.5–4.5)
Glucose: 92 mg/dL (ref 70–99)
Potassium: 4.5 mmol/L (ref 3.5–5.2)
Sodium: 141 mmol/L (ref 134–144)
Total Protein: 6.9 g/dL (ref 6.0–8.5)
eGFR: 105 mL/min/{1.73_m2} (ref >59)

## 2023-11-15 LAB — LIPID PANEL
Chol/HDL Ratio: 2.6 {ratio} (ref 0.0–4.4)
Cholesterol, Total: 196 mg/dL (ref 100–199)
HDL: 75 mg/dL (ref >39)
LDL Chol Calc (NIH): 100 mg/dL — ABNORMAL HIGH (ref 0–99)
Triglycerides: 121 mg/dL (ref 0–149)
VLDL Cholesterol Cal: 21 mg/dL (ref 5–40)

## 2023-11-15 NOTE — Progress Notes (Signed)
LDL slightly high, but HDL really good. Lets await coronary CTA before further recommendations re: lipid therapy.  Thanks MJP

## 2023-12-03 ENCOUNTER — Telehealth: Payer: Self-pay | Admitting: *Deleted

## 2023-12-03 NOTE — Telephone Encounter (Signed)
-----   Message from Judge Notice sent at 11/29/2023  9:42 AM EST ----- Scheduled 12/12/23 at 1:30

## 2023-12-06 ENCOUNTER — Ambulatory Visit (HOSPITAL_COMMUNITY): Payer: BC Managed Care – PPO | Attending: Cardiology

## 2023-12-06 ENCOUNTER — Encounter: Payer: Self-pay | Admitting: Cardiology

## 2023-12-06 DIAGNOSIS — R011 Cardiac murmur, unspecified: Secondary | ICD-10-CM | POA: Insufficient documentation

## 2023-12-06 LAB — ECHOCARDIOGRAM COMPLETE
Area-P 1/2: 3.39 cm2
S' Lateral: 2.7 cm

## 2023-12-10 ENCOUNTER — Encounter (HOSPITAL_COMMUNITY): Payer: Self-pay

## 2023-12-11 ENCOUNTER — Telehealth (HOSPITAL_COMMUNITY): Payer: Self-pay | Admitting: *Deleted

## 2023-12-11 NOTE — Telephone Encounter (Signed)
 Reaching out to patient to offer assistance regarding upcoming cardiac imaging study; patient request to r/s.  Appt rescheduled.  Katrina Frame RN Navigator Cardiac Imaging [facility] [phone] office [phone] cell

## 2023-12-12 ENCOUNTER — Ambulatory Visit (HOSPITAL_COMMUNITY): Admission: RE | Admit: 2023-12-12 | Payer: BC Managed Care – PPO | Source: Ambulatory Visit

## 2023-12-24 ENCOUNTER — Encounter (HOSPITAL_COMMUNITY): Payer: Self-pay

## 2023-12-25 ENCOUNTER — Telehealth (HOSPITAL_COMMUNITY): Payer: Self-pay | Admitting: *Deleted

## 2023-12-25 NOTE — Telephone Encounter (Signed)
Reaching out to patient to offer assistance regarding upcoming cardiac imaging study; pt verbalizes understanding of appt date/time, parking situation and where to check in, pre-test NPO status and medications ordered, and verified current allergies; name and call back number provided for further questions should they arise  Larey Brick RN Navigator Cardiac Imaging [facility] [phone] office [phone] cell  Patient to take 100mg  metoprolol tartrate two hours prior to her cardiac CT scan.  She is aware to arrive at 11:30 AM.

## 2023-12-26 ENCOUNTER — Ambulatory Visit (HOSPITAL_COMMUNITY)
Admission: RE | Admit: 2023-12-26 | Discharge: 2023-12-26 | Disposition: A | Discharge status: Home / Self Care | Payer: BC Managed Care – PPO | Source: Ambulatory Visit | Attending: Cardiology | Admitting: Cardiology

## 2023-12-26 DIAGNOSIS — R931 Abnormal findings on diagnostic imaging of heart and coronary circulation: Secondary | ICD-10-CM | POA: Insufficient documentation

## 2023-12-26 DIAGNOSIS — R072 Precordial pain: Secondary | ICD-10-CM | POA: Insufficient documentation

## 2023-12-26 DIAGNOSIS — I2510 Atherosclerotic heart disease of native coronary artery without angina pectoris: Secondary | ICD-10-CM

## 2023-12-26 MED ORDER — NITROGLYCERIN 0.4 MG SL SUBL
SUBLINGUAL_TABLET | SUBLINGUAL | Status: AC
  Filled 2023-12-26: qty 2

## 2023-12-26 MED ORDER — IOHEXOL 350 MG/ML SOLN
95.0000 mL | Freq: Once | INTRAVENOUS | Status: AC | PRN
  Administered 2023-12-26: 95 mL via INTRAVENOUS

## 2023-12-26 MED ORDER — NITROGLYCERIN 0.4 MG SL SUBL
0.8000 mg | SUBLINGUAL_TABLET | Freq: Once | SUBLINGUAL | Status: AC
  Administered 2023-12-26: 0.8 mg via SUBLINGUAL

## 2023-12-27 ENCOUNTER — Other Ambulatory Visit: Payer: Self-pay | Admitting: Cardiology

## 2023-12-27 ENCOUNTER — Ambulatory Visit (HOSPITAL_COMMUNITY)
Admission: RE | Admit: 2023-12-27 | Discharge: 2023-12-27 | Disposition: A | Discharge status: Home / Self Care | Source: Ambulatory Visit | Attending: Cardiology | Admitting: Cardiology

## 2023-12-27 DIAGNOSIS — R931 Abnormal findings on diagnostic imaging of heart and coronary circulation: Secondary | ICD-10-CM | POA: Diagnosis not present

## 2023-12-27 DIAGNOSIS — R072 Precordial pain: Secondary | ICD-10-CM | POA: Diagnosis not present

## 2023-12-27 NOTE — Progress Notes (Signed)
ffr 

## 2024-01-02 NOTE — Progress Notes (Signed)
 Moderate nonobstructive coronary artery disease.  Recommend adding aspirin 81 mg daily.  Strongly recommending vaping cessation.  Please check if the patient is taking Crestor 20 mg daily.  If yes, recommend increasing to 40 mg daily and repeat lipid panel in 3 months with goal LDL <70.  If patient is not taking Crestor 20 mg daily, please emphasize compliance.  Thanks MJP

## 2024-01-03 ENCOUNTER — Telehealth: Payer: Self-pay | Admitting: *Deleted

## 2024-01-03 DIAGNOSIS — R931 Abnormal findings on diagnostic imaging of heart and coronary circulation: Secondary | ICD-10-CM

## 2024-01-03 DIAGNOSIS — Z79899 Other long term (current) drug therapy: Secondary | ICD-10-CM

## 2024-01-03 DIAGNOSIS — E782 Mixed hyperlipidemia: Secondary | ICD-10-CM

## 2024-01-03 MED ORDER — ROSUVASTATIN CALCIUM 40 MG PO TABS: 40.0000 mg | ORAL_TABLET | Freq: Every day | ORAL | 2 refills | Status: DC

## 2024-01-03 MED ORDER — ASPIRIN 81 MG PO TBEC: 81.0000 mg | DELAYED_RELEASE_TABLET | Freq: Every day | ORAL | Status: AC

## 2024-01-03 NOTE — Telephone Encounter (Signed)
 The patient has been notified of the result and verbalized understanding.  All questions (if any) were answered.  Pt is taking crestor 20 mg po daily with no skipped doses.  Advised her to start taking ASA 81 mg po daily and we will now increase her crestor to 40 mg po daily and have her come in, in 3 months for repeat lipids.  Confirmed the pharmacy of choice with the pt.  3 month lipids placed and released in the system.  Pt wrote this down on her calendar. Pt aware to go fasting to that lab appt.   Pt verbalized understanding and agrees with this plan.

## 2024-01-03 NOTE — Telephone Encounter (Signed)
-----   Message from Nurse Kinnie Feil C sent at 01/03/2024  9:20 AM EDT -----  ----- Message ----- From: Elder Negus, MD Sent: 01/02/2024   8:28 AM EDT To: [facility] T[facility]  Moderate nonobstructive coronary artery disease.  Recommend adding aspirin 81 mg daily.  Strongly recommending vaping cessation.  Please check if the patient is taking Crestor 20 mg daily.  If yes, recommend increasing to 40 mg daily and repeat lipid panel in 3 months with goal LDL <70.  If patient is not taking Crestor 20 mg daily, please emphasize compliance.  Thanks MJP

## 2024-03-19 ENCOUNTER — Telehealth: Payer: Self-pay | Admitting: Cardiology

## 2024-03-19 DIAGNOSIS — R931 Abnormal findings on diagnostic imaging of heart and coronary circulation: Secondary | ICD-10-CM

## 2024-03-19 DIAGNOSIS — Z79899 Other long term (current) drug therapy: Secondary | ICD-10-CM

## 2024-03-19 DIAGNOSIS — E782 Mixed hyperlipidemia: Secondary | ICD-10-CM

## 2024-03-19 MED ORDER — ROSUVASTATIN CALCIUM 40 MG PO TABS: 40.0000 mg | ORAL_TABLET | Freq: Every day | ORAL | 1 refills | Status: DC

## 2024-03-19 NOTE — Telephone Encounter (Signed)
 Agree with the above recommendations.  Thanks MJP

## 2024-03-19 NOTE — Telephone Encounter (Signed)
*  STAT* If patient is at the pharmacy, call can be transferred to refill team.   1. Which medications need to be refilled? (please list name of each medication and dose if known)  rosuvastatin  (CRESTOR ) 40 MG tablet  2. Which pharmacy/location (including street and city if local pharmacy) is medication to be sent to? [id number] - [place], [place] - [address]   3. Do they need a 30 day or 90 day supply? 90 dose increase to 80mg  per pt, needs new rx sent to pharmacy. Has doubled up and is now out of 40mg

## 2024-03-19 NOTE — Telephone Encounter (Signed)
 Pt has accidentally been taking 80 mg since March.  Educated that max dose is 40 mg. Discussed with Melissa, pharmD. Advised pt to hold it for 2 weeks and then restart at 40 mg daily. Advised to drop by the office next week for LFTs. Aware forwarding to MD in case he has other advisement or more lab testing he would like to add. Pt aware office will let her know if he wants to do anything different than instructions above.  She is agreeable to plan.

## 2024-09-16 ENCOUNTER — Encounter: Payer: Self-pay | Admitting: Cardiology

## 2024-10-24 ENCOUNTER — Other Ambulatory Visit: Payer: Self-pay | Admitting: Cardiology

## 2024-10-24 DIAGNOSIS — Z79899 Other long term (current) drug therapy: Secondary | ICD-10-CM

## 2024-10-24 DIAGNOSIS — E782 Mixed hyperlipidemia: Secondary | ICD-10-CM

## 2024-10-24 DIAGNOSIS — R931 Abnormal findings on diagnostic imaging of heart and coronary circulation: Secondary | ICD-10-CM

## 2024-11-05 ENCOUNTER — Other Ambulatory Visit: Payer: Self-pay | Admitting: Cardiology
